# Patient Record
Sex: Female | Born: 1995 | Race: White | Hispanic: No | Marital: Single | Smoking: Never smoker
Health system: Southern US, Community
[De-identification: ages and names within clinical notes are randomized; demographics above are authoritative.]

## PROBLEM LIST (undated history)

## (undated) DIAGNOSIS — E162 Hypoglycemia, unspecified: Secondary | ICD-10-CM

## (undated) HISTORY — PX: TONSILLECTOMY: SUR1361

---

## 2015-09-27 ENCOUNTER — Emergency Department (HOSPITAL_COMMUNITY)
Admission: EM | Admit: 2015-09-27 | Discharge: 2015-09-27 | Disposition: A | Discharge status: Home / Self Care | Payer: Commercial Managed Care - HMO | Attending: Emergency Medicine | Admitting: Emergency Medicine

## 2015-09-27 ENCOUNTER — Emergency Department (HOSPITAL_COMMUNITY): Payer: Commercial Managed Care - HMO

## 2015-09-27 ENCOUNTER — Encounter (HOSPITAL_COMMUNITY): Payer: Self-pay | Admitting: Emergency Medicine

## 2015-09-27 DIAGNOSIS — Y999 Unspecified external cause status: Secondary | ICD-10-CM | POA: Diagnosis not present

## 2015-09-27 DIAGNOSIS — S61219A Laceration without foreign body of unspecified finger without damage to nail, initial encounter: Secondary | ICD-10-CM

## 2015-09-27 DIAGNOSIS — W260XXA Contact with knife, initial encounter: Secondary | ICD-10-CM | POA: Insufficient documentation

## 2015-09-27 DIAGNOSIS — Y929 Unspecified place or not applicable: Secondary | ICD-10-CM | POA: Insufficient documentation

## 2015-09-27 DIAGNOSIS — S61211A Laceration without foreign body of left index finger without damage to nail, initial encounter: Secondary | ICD-10-CM | POA: Insufficient documentation

## 2015-09-27 DIAGNOSIS — Z79899 Other long term (current) drug therapy: Secondary | ICD-10-CM | POA: Diagnosis not present

## 2015-09-27 DIAGNOSIS — Y9389 Activity, other specified: Secondary | ICD-10-CM | POA: Diagnosis not present

## 2015-09-27 HISTORY — DX: Hypoglycemia, unspecified: E16.2

## 2015-09-27 LAB — CBG MONITORING, ED: GLUCOSE-CAPILLARY: 145 mg/dL — AB (ref 65–99)

## 2015-09-27 MED ORDER — HYDROCODONE-ACETAMINOPHEN 5-325 MG PO TABS: 1.0000 | ORAL_TABLET | ORAL | 0 refills | Status: AC | PRN

## 2015-09-27 MED ORDER — BACITRACIN ZINC 500 UNIT/GM EX OINT: 1.0000 "application " | TOPICAL_OINTMENT | Freq: Two times a day (BID) | CUTANEOUS | 0 refills | Status: DC

## 2015-09-27 MED ORDER — LIDOCAINE HCL (PF) 1 % IJ SOLN: 5.0000 mL | Freq: Once | INTRAMUSCULAR | Status: DC

## 2015-09-27 MED ORDER — CEPHALEXIN 500 MG PO CAPS
500.0000 mg | ORAL_CAPSULE | Freq: Three times a day (TID) | ORAL | 0 refills | Status: AC
Start: 2015-09-27 — End: 2015-10-04

## 2015-09-27 MED ORDER — LIDOCAINE HCL 1 % IJ SOLN
INTRAMUSCULAR | Status: AC
Start: 2015-09-27 — End: 2015-09-27
  Administered 2015-09-27: 5 mL
  Filled 2015-09-27: qty 20

## 2015-09-27 NOTE — ED Provider Notes (Signed)
[facility]-EMERGENCY DEPT Provider Note   CSN: [id number] Arrival date & time: 09/27/15  1941  First Provider Contact:  None    INITIAL DOCUMENTATION DELAYED DUE TO EPIC DOWNTIME  History   Chief Complaint Chief Complaint  Patient presents with  . Finger Injury  . Hypotension    HPI Josslyn Ciolek is a 20 y.o. female.  HPI 20 yo RHD female who p/w laceration to left index finger. Pt was cutting an avocado this evening when her knife slipped, cutting her left index finger. She immediately applied pressure and presented to ED. On arrival, she looked at her finger and felt lightheaded. She has h/o vagal reactions to blood. Currently endorses 7/10, sharp, stinging left finger pain. Mild numbness along ulnar aspect of digit distal to injury. No limitation in ROM. Tetanus is UTD. No blood thinner use.  Past Medical History:  Diagnosis Date  . Hypoglycemia     There are no active problems to display for this patient.   Past Surgical History:  Procedure Laterality Date  . TONSILLECTOMY      OB History    No data available       Home Medications    Prior to Admission medications   Medication Sig Start Date End Date Taking? Authorizing Provider  Norgestimate-Ethinyl Estradiol Triphasic (TRI-PREVIFEM) 0.18/0.215/0.25 MG-35 MCG tablet Take 1 tablet by mouth daily.   Yes Historical Provider, MD  bacitracin ointment Apply 1 application topically 2 (two) times daily. 09/27/15   Shaune Pollack, MD  cephALEXin (KEFLEX) 500 MG capsule Take 1 capsule (500 mg total) by mouth 3 (three) times daily. 09/27/15 10/04/15  Shaune Pollack, MD  HYDROcodone-acetaminophen (NORCO/VICODIN) 5-325 MG tablet Take 1 tablet by mouth every 4 (four) hours as needed for severe pain. 09/27/15   Shaune Pollack, MD    Family History History reviewed. No pertinent family history.  Social History Social History  Substance Use Topics  . Smoking status: Never Smoker  . Smokeless tobacco: Never Used  . Alcohol use Yes      Allergies   Sulfa antibiotics   Review of Systems Review of Systems  Constitutional: Negative for chills and fatigue.  Skin: Positive for wound. Negative for rash.  Neurological: Positive for numbness. Negative for syncope and weakness.  Hematological: Does not bruise/bleed easily.  All other systems reviewed and are negative.    Physical Exam Updated Vital Signs BP 103/59   Pulse 62   Temp 97.9 F (36.6 C) (Oral)   Resp 18   LMP 08/26/2015   SpO2 99%   Physical Exam  Constitutional: She is oriented to person, place, and time. She appears well-developed and well-nourished. No distress.  HENT:  Head: Normocephalic and atraumatic.  Eyes: Conjunctivae are normal.  Neck: Neck supple.  Cardiovascular: Normal rate, regular rhythm and normal heart sounds.   Pulmonary/Chest: Effort normal. No respiratory distress. She has no wheezes.  Abdominal: She exhibits no distension.  Musculoskeletal: She exhibits no edema.  Neurological: She is alert and oriented to person, place, and time. She exhibits normal muscle tone.  Skin: Skin is warm. Capillary refill takes less than 2 seconds. No rash noted.  Nursing note and vitals reviewed.   UPPER EXTREMITY EXAM: LEFT  INSPECTION & PALPATION: Approx 2 cm linear laceration to palmar aspect of proximal phalanx of index finger, with involvement of underlying adipose tissue. No extension to underlying flexor tendon. Wound does extend ulnar aspect of digit with no pulsatile bleeding or exposed digital vasculature. ROM is full. No visible  FB. No penetration to deep tissue or joint on local exploration.  SENSORY: Sensation is intact to light touch in:  Superficial radial nerve distribution (dorsal first web space) Median nerve distribution (tip of index finger)   Ulnar nerve distribution (tip of small finger)     Sensation is mildly diminished along ulnar aspect of index finger distal to laceration, though overall intact  MOTOR:  +  Motor posterior interosseous nerve (thumb IP extension) + Anterior interosseous nerve (thumb IP flexion, index finger DIP flexion) + Radial nerve (wrist extension) + Median nerve (palpable firing thenar mass) + Ulnar nerve (palpable firing of first dorsal interosseous muscle)  VASCULAR: 2+ radial pulse Brisk capillary refill < 2 sec, fingers warm and well-perfused  TENDONS: Individual flexion at index finger MCP, PIP, and DIP is intact Extension at index finger DIP, PIP joint fully intact Individual extension of index finger is intact  ED Treatments / Results  Labs (all labs ordered are listed, but only abnormal results are displayed) Labs Reviewed  CBG MONITORING, ED - Abnormal; Notable for the following:       Result Value   Glucose-Capillary 145 (*)    All other components within normal limits    EKG  EKG Interpretation None       Radiology Dg Finger Index Left  Result Date: 09/27/2015 CLINICAL DATA:  Laceration of the left index finger tonight. Diaphoresis. EXAM: LEFT INDEX FINGER 2+V COMPARISON:  None. FINDINGS: Radial sided laceration of the index finger at the level of the shaft of the proximal phalanx. No underlying foreign body or fracture. No arthropathy. IMPRESSION: 1. Laceration of the left index finger without fracture or foreign body observed. Electronically Signed   By: Gaylyn RongWalter  Liebkemann M.D.   On: 09/27/2015 20:36    Procedures .Marland Kitchen.Laceration Repair Date/Time: 09/28/2015 11:38 AM Performed by: Shaune PollackISAACS, Eleonore Shippee Authorized by: Shaune PollackISAACS, Donnis Phaneuf   Consent:    Consent obtained:  Verbal   Consent given by:  Patient   Risks discussed:  Infection, need for additional repair, nerve damage, poor cosmetic result, pain, poor wound healing, retained foreign body, tendon damage and vascular damage   Alternatives discussed:  No treatment Anesthesia (see MAR for exact dosages):    Anesthesia method:  Nerve block   Block location:  Digital   Block needle gauge:  25 G    Block anesthetic:  Lidocaine 1% w/o epi   Block technique:  5   Block injection procedure:  Anatomic landmarks identified, introduced needle, incremental injection, negative aspiration for blood and anatomic landmarks palpated   Block outcome:  Anesthesia achieved Laceration details:    Location:  Finger   Finger location:  L index finger   Length (cm):  2   Depth (mm):  1 Repair type:    Repair type:  Simple Pre-procedure details:    Preparation:  Patient was prepped and draped in usual sterile fashion and imaging obtained to evaluate for foreign bodies Exploration:    Hemostasis achieved with:  Direct pressure   Wound exploration: wound explored through full range of motion and entire depth of wound probed and visualized     Wound extent: no foreign bodies/material noted, no muscle damage noted, no tendon damage noted, no underlying fracture noted and no vascular damage noted     Contaminated: no   Treatment:    Area cleansed with:  Betadine   Amount of cleaning:  Extensive   Irrigation solution:  Sterile saline   Irrigation volume:  250   Irrigation  method:  Pressure wash and syringe Skin repair:    Repair method:  Sutures   Suture size:  4-0   Suture material:  Prolene   Suture technique:  Simple interrupted   Number of sutures:  7 Approximation:    Approximation:  Close   Vermilion border: well-aligned   Post-procedure details:    Dressing:  Antibiotic ointment and splint for protection   Patient tolerance of procedure:  Tolerated well, no immediate complications   (including critical care time)  Medications Ordered in ED Medications  lidocaine (PF) (XYLOCAINE) 1 % injection 5 mL (5 mLs Intradermal Not Given 09/27/15 2051)  lidocaine (XYLOCAINE) 1 % (with pres) injection (5 mLs  Given 09/27/15 2043)     Initial Impression / Assessment and Plan / ED Course  I have reviewed the triage vital signs and the nursing notes.  Pertinent labs & imaging results that were  available during my care of the patient were reviewed by me and considered in my medical decision making (see chart for details).  Clinical Course   20 yo F with no significant PMHx who p/w laceration to proximal phalanx of index finger. Bleeding controlled. Exam as above. Wound fully explored and visualized through full ROM. No apparent joint or tendon involvement. Pt did report mild numbness distal to injury along ulnar aspect, c/f possible dista ulnar digital nerve injury, though wound is superficial with no exposure of nerve bundle on local exploration. Wound irrigated extensively then repaired as above. Will place in splint for wound protection, healing, and give prophylactic abx. Return precautions given.  Final Clinical Impressions(s) / ED Diagnoses   Final diagnoses:  Finger laceration, initial encounter    New Prescriptions New Prescriptions   BACITRACIN OINTMENT    Apply 1 application topically 2 (two) times daily.   CEPHALEXIN (KEFLEX) 500 MG CAPSULE    Take 1 capsule (500 mg total) by mouth 3 (three) times daily.   HYDROCODONE-ACETAMINOPHEN (NORCO/VICODIN) 5-325 MG TABLET    Take 1 tablet by mouth every 4 (four) hours as needed for severe pain.     Shaune Pollack, MD 09/28/15 [id number]

## 2015-09-27 NOTE — ED Triage Notes (Signed)
Patient reports laceration to left index finger tonight.  Reports she cut it with a serrated knife.  Bleeding controlled.  Patient noted to be diaphoretic on arrival to ED.  States that she had this happen in the past but does have history of hypoglycemia.  BP88/50 in triage. Patient is alert and oriented

## 2015-10-07 ENCOUNTER — Ambulatory Visit (INDEPENDENT_AMBULATORY_CARE_PROVIDER_SITE_OTHER): Payer: Commercial Managed Care - HMO | Admitting: Family Medicine

## 2015-10-07 ENCOUNTER — Encounter: Payer: Self-pay | Admitting: Family Medicine

## 2015-10-07 VITALS — BP 112/58 | HR 82 | Temp 98.2°F | Resp 20 | Ht 68.0 in | Wt 134.0 lb

## 2015-10-07 DIAGNOSIS — S61211A Laceration without foreign body of left index finger without damage to nail, initial encounter: Secondary | ICD-10-CM | POA: Diagnosis not present

## 2015-10-07 DIAGNOSIS — S61218A Laceration without foreign body of other finger without damage to nail, initial encounter: Secondary | ICD-10-CM

## 2015-10-07 NOTE — Progress Notes (Signed)
20 yo [employer] who is 10 days out from a left index finger laceration.  She still has some numbness at the tip of the finger.  Objective: BP (!) 112/58   Pulse 82   Temp 98.2 F (36.8 C) (Oral)   Resp 20   Ht 5\' 8"  (1.727 m)   Wt 134 lb (60.8 kg)   LMP 10/04/2015   SpO2 96%   BMI 20.37 kg/m  7 sutures removed with some dihiscence and so dermabond was used to reinforce the healing  Assessment:  No sign of infection.  Wound closed with dermabond.  Elvina SidleKurt Janne Faulk, MD

## 2015-10-07 NOTE — Patient Instructions (Signed)
     IF you received an x-ray today, you will receive an invoice from [facility]. Please contact [facility] at [phone] with questions or concerns regarding your invoice.   IF you received labwork today, you will receive an invoice from [facility]. Please contact [facility] at [phone] with questions or concerns regarding your invoice.   Our billing staff will not be able to assist you with questions regarding bills from these companies.  You will be contacted with the lab results as soon as they are available. The fastest way to get your results is to activate your My Chart account. Instructions are located on the last page of this paperwork. If you have not heard from us regarding the results in 2 weeks, please contact this office.      

## 2017-03-16 IMAGING — CR DG FINGER INDEX 2+V*L*
3 series · 3 of 3 positions shown · non-contrast
Comparison: None.

CLINICAL DATA: Laceration of the left index finger tonight.
Diaphoresis.

EXAM:
LEFT INDEX FINGER 2+V

[x finger pa left]
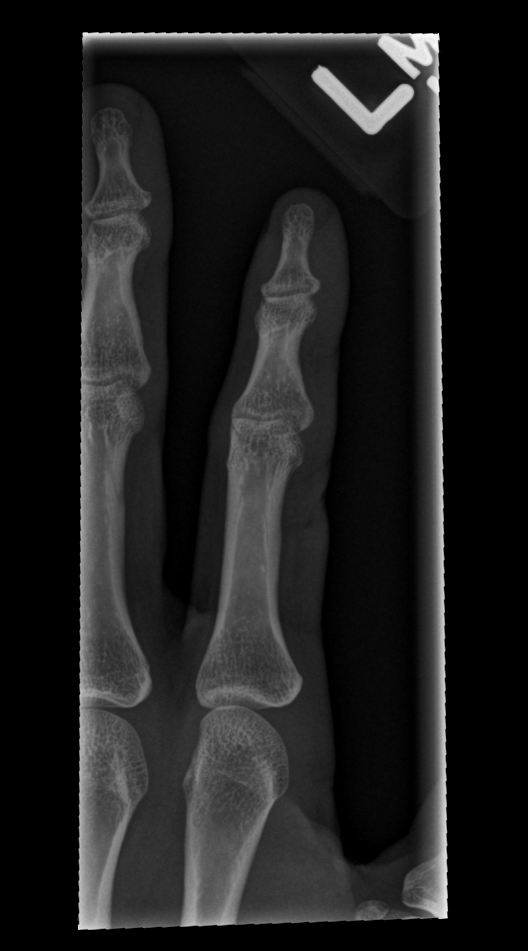

[x finger obl left]
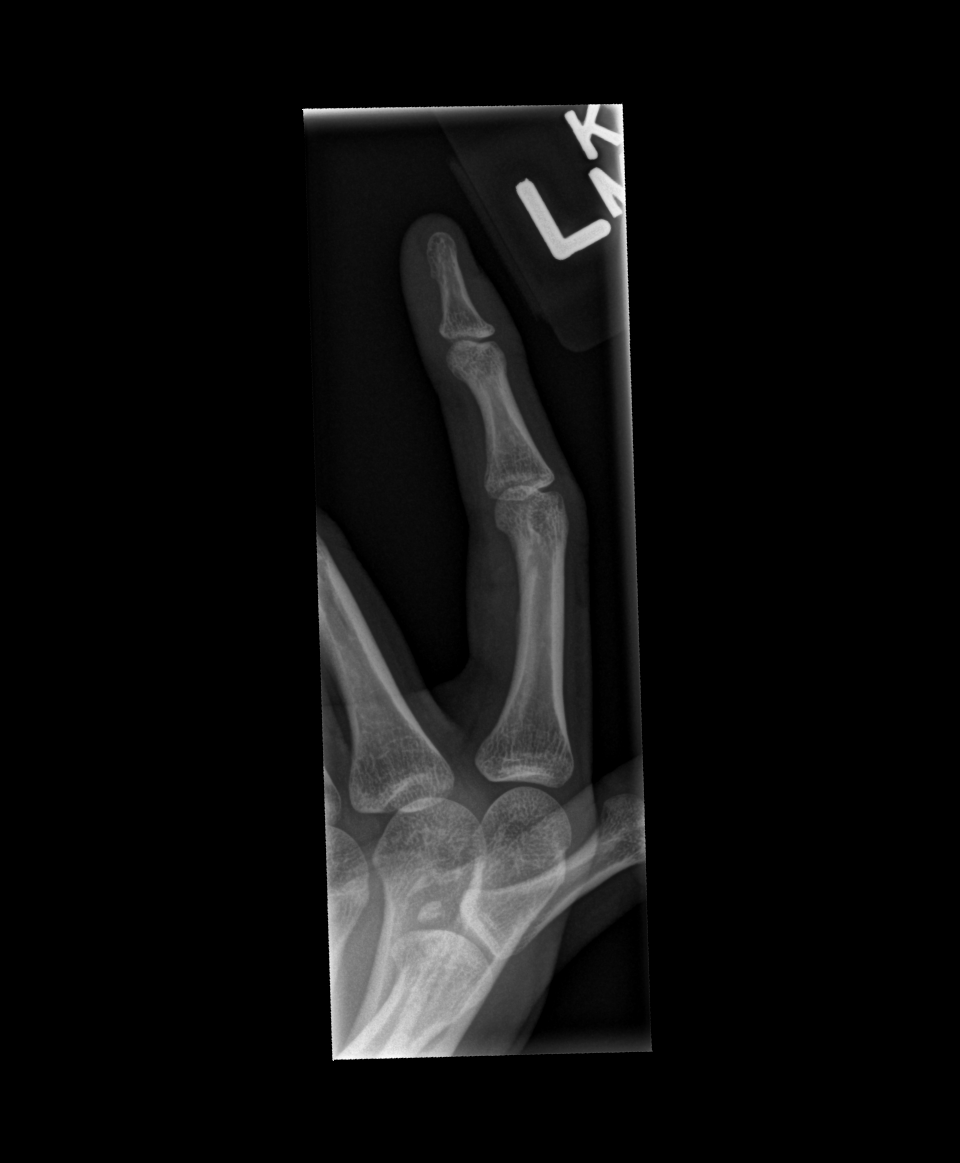

[x finger lat left]
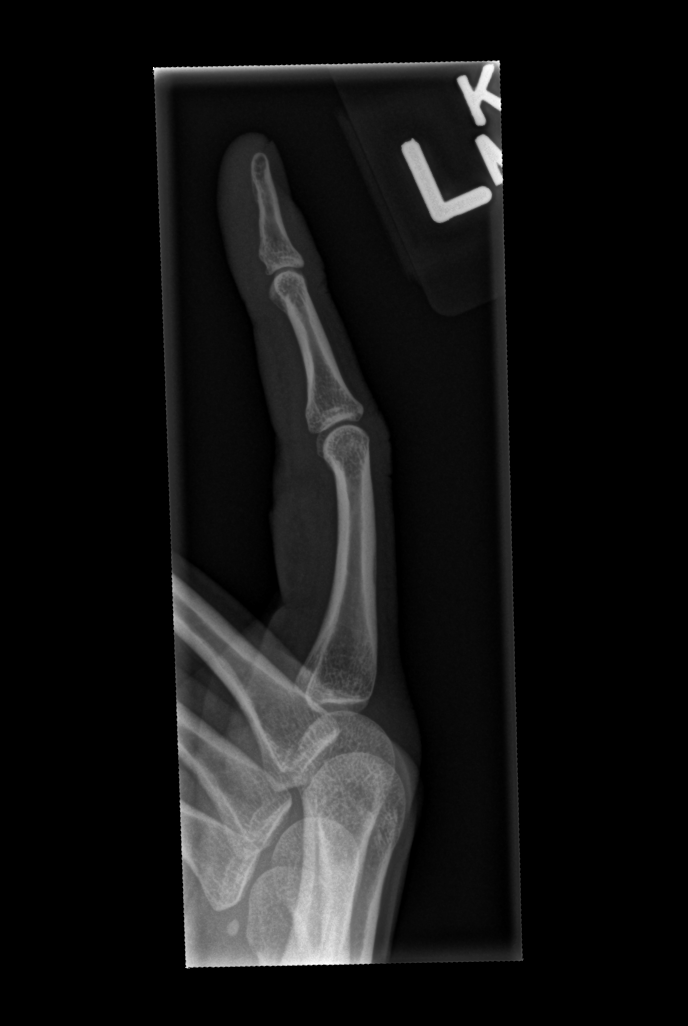

[3 of 3 positions shown; findings below may reference images not displayed]

FINDINGS: Radial sided laceration of the index finger at the level of the
shaft of the proximal phalanx. No underlying foreign body or
fracture. No arthropathy.
IMPRESSION: 1. Laceration of the left index finger without fracture or foreign
body observed.
